# Patient Record
Sex: Male | Born: 1980 | Hispanic: Yes | Marital: Single | Smoking: Current every day smoker
Health system: Southern US, Community
[De-identification: ages and names within clinical notes are randomized; demographics above are authoritative.]

## PROBLEM LIST (undated history)

## (undated) DIAGNOSIS — H269 Unspecified cataract: Secondary | ICD-10-CM

## (undated) DIAGNOSIS — E119 Type 2 diabetes mellitus without complications: Secondary | ICD-10-CM

## (undated) HISTORY — DX: Type 2 diabetes mellitus without complications: E11.9

## (undated) HISTORY — DX: Unspecified cataract: H26.9

---

## 2014-05-05 ENCOUNTER — Ambulatory Visit (INDEPENDENT_AMBULATORY_CARE_PROVIDER_SITE_OTHER): Payer: BLUE CROSS/BLUE SHIELD | Admitting: Family Medicine

## 2014-05-05 ENCOUNTER — Ambulatory Visit (INDEPENDENT_AMBULATORY_CARE_PROVIDER_SITE_OTHER): Payer: BLUE CROSS/BLUE SHIELD

## 2014-05-05 VITALS — BP 112/72 | HR 78 | Temp 98.6°F | Resp 16 | Ht 66.5 in | Wt 181.4 lb

## 2014-05-05 DIAGNOSIS — E1165 Type 2 diabetes mellitus with hyperglycemia: Secondary | ICD-10-CM | POA: Diagnosis not present

## 2014-05-05 DIAGNOSIS — R1031 Right lower quadrant pain: Secondary | ICD-10-CM | POA: Diagnosis not present

## 2014-05-05 DIAGNOSIS — E119 Type 2 diabetes mellitus without complications: Secondary | ICD-10-CM

## 2014-05-05 DIAGNOSIS — Z113 Encounter for screening for infections with a predominantly sexual mode of transmission: Secondary | ICD-10-CM | POA: Diagnosis not present

## 2014-05-05 DIAGNOSIS — K5900 Constipation, unspecified: Secondary | ICD-10-CM

## 2014-05-05 LAB — POCT CBC
Granulocyte percent: 61.6 % (ref 37–80)
HCT, POC: 52.3 % (ref 43.5–53.7)
Hemoglobin: 17.5 g/dL (ref 14.1–18.1)
Lymph, poc: 2.2 (ref 0.6–3.4)
MCH, POC: 30.1 pg (ref 27–31.2)
MCHC: 33.4 g/dL (ref 31.8–35.4)
MCV: 90.1 fL (ref 80–97)
MID (cbc): 0.4 (ref 0–0.9)
MPV: 8.2 fL (ref 0–99.8)
POC Granulocyte: 4.3 (ref 2–6.9)
POC LYMPH PERCENT: 32.3 %L (ref 10–50)
POC MID %: 6.1 %M (ref 0–12)
Platelet Count, POC: 180 10*3/uL (ref 142–424)
RBC: 5.81 M/uL (ref 4.69–6.13)
RDW, POC: 13 %
WBC: 6.9 10*3/uL (ref 4.6–10.2)

## 2014-05-05 LAB — POCT URINALYSIS DIPSTICK
Bilirubin, UA: NEGATIVE
Blood, UA: NEGATIVE
Glucose, UA: 500
Ketones, UA: NEGATIVE
Leukocytes, UA: NEGATIVE
Nitrite, UA: NEGATIVE
Protein, UA: NEGATIVE
Spec Grav, UA: 1.01
Urobilinogen, UA: 0.2
pH, UA: 5

## 2014-05-05 LAB — POCT UA - MICROSCOPIC ONLY
Bacteria, U Microscopic: NEGATIVE
Casts, Ur, LPF, POC: NEGATIVE
Crystals, Ur, HPF, POC: NEGATIVE
Epithelial cells, urine per micros: NEGATIVE
Mucus, UA: NEGATIVE
WBC, Ur, HPF, POC: NEGATIVE
Yeast, UA: NEGATIVE

## 2014-05-05 LAB — GLUCOSE, POCT (MANUAL RESULT ENTRY): POC Glucose: 317 mg/dl — AB (ref 70–99)

## 2014-05-05 LAB — POCT GLYCOSYLATED HEMOGLOBIN (HGB A1C): Hemoglobin A1C: 12.2

## 2014-05-05 MED ORDER — POLYETHYLENE GLYCOL 3350 17 GM/SCOOP PO POWD: 17.0000 g | Freq: Two times a day (BID) | ORAL | Status: DC | PRN

## 2014-05-05 NOTE — Patient Instructions (Addendum)
Diabetes and Standards of Medical Care Diabetes is complicated. You may find that your diabetes team includes a dietitian, nurse, diabetes educator, eye doctor, and more. To help everyone know what is going on and to help you get the care you deserve, the following schedule of care was developed to help keep you on track. Below are the tests, exams, vaccines, medicines, education, and plans you will need. HbA1c test This test shows how well you have controlled your glucose over the past 2-3 months. It is used to see if your diabetes management plan needs to be adjusted.   It is performed at least 2 times a year if you are meeting treatment goals.  It is performed 4 times a year if therapy has changed or if you are not meeting treatment goals. Blood pressure test  This test is performed at every routine medical visit. The goal is less than 140/90 mm Hg for most people, but 130/80 mm Hg in some cases. Ask your health care provider about your goal. Dental exam  Follow up with the dentist regularly. Eye exam  If you are diagnosed with type 1 diabetes as a child, get an exam upon reaching the age of 37 years or older and have had diabetes for 3-5 years. Yearly eye exams are recommended after that initial eye exam.  If you are diagnosed with type 1 diabetes as an adult, get an exam within 5 years of diagnosis and then yearly.  If you are diagnosed with type 2 diabetes, get an exam as soon as possible after the diagnosis and then yearly. Foot care exam  Visual foot exams are performed at every routine medical visit. The exams check for cuts, injuries, or other problems with the feet.  A comprehensive foot exam should be done yearly. This includes visual inspection as well as assessing foot pulses and testing for loss of sensation.  Check your feet nightly for cuts, injuries, or other problems with your feet. Tell your health care provider if anything is not healing. Kidney function test (urine  microalbumin)  This test is performed once a year.  Type 1 diabetes: The first test is performed 5 years after diagnosis.  Type 2 diabetes: The first test is performed at the time of diagnosis.  A serum creatinine and estimated glomerular filtration rate (eGFR) test is done once a year to assess the level of chronic kidney disease (CKD), if present. Lipid profile (cholesterol, HDL, LDL, triglycerides)  Performed every 5 years for most people.  The goal for LDL is less than 100 mg/dL. If you are at high risk, the goal is less than 70 mg/dL.  The goal for HDL is 40 mg/dL-50 mg/dL for men and 50 mg/dL-60 mg/dL for women. An HDL cholesterol of 60 mg/dL or higher gives some protection against heart disease.  The goal for triglycerides is less than 150 mg/dL. Influenza vaccine, pneumococcal vaccine, and hepatitis B vaccine  The influenza vaccine is recommended yearly.  It is recommended that people with diabetes who are over 24 years old get the pneumonia vaccine. In some cases, two separate shots may be given. Ask your health care provider if your pneumonia vaccination is up to date.  The hepatitis B vaccine is also recommended for adults with diabetes. Diabetes self-management education  Education is recommended at diagnosis and ongoing as needed. Treatment plan  Your treatment plan is reviewed at every medical visit. Document Released: 12/19/2008 Document Revised: 07/08/2013 Document Reviewed: 07/24/2012 [facility] Patient Information 2015 [place],  LLC. This information is not intended to replace advice given to you by your health care provider. Make sure you discuss any questions you have with your health care provider. Diabetes y Kandace Blitz fsica (Diabetes and Exercise) Hacer actividad fsica con regularidad es muy importante. No se trata solo de [employer]. Tiene muchos otros beneficios, como por ejemplo:  Mejorar el estado fsico, la flexibilidad y la resistencia.  Aumenta  la densidad sea.  Ayuda a Technical sales engineer.  Disminuye la Air traffic controller.  Aumenta la fuerza muscular.  Reduce el estrs y las tensiones.  Mejora el estado de salud general. Las personas diabticas que realizan actividad fsica tienen beneficios adicionales debido al ejercicio:  Reduce el apetito.  El organismo mejora el uso del azcar (glucosa) de la [place].  Ayuda a disminuir o Product/process development scientist.  Disminuye la presin arterial.  Ayuda a disminuir los lpidos en la sangre (colesterol y triglicridos).  El organismo mejora el uso de la insulina porque:  Aumenta la sensibilidad del organismo a la insulina.  Reduce las necesidades de insulina del organismo.  Disminuye el riesgo de enfermedad cardaca por la actividad fsica ya que  disminuye el colesterol y [employer] triglicridos.  Aumenta los niveles de colesterol bueno (como las lipoprotenas de alta densidad [HDL]) en el organismo.  Disminuye los niveles de glucosa en la [place]. SU PLAN DE ACTIVIDAD  Elija una actividad que disfrute y establezca objetivos realistas. Su mdico o educador en diabetes podrn ayudarlo a encontrar una actividad que lo beneficie. Haga ejercicio regularmente como se lo haya indicado el mdico. Esto incluye:  Hacer entrenamiento de [employer] a la semana, como flexiones, sentadillas, levantar peso o usar bandas de resistencia.  Practicar 156minutos de ejercicios cardiovasculares cada semana, como caminar, correr o hacer algn deporte.  Mantenerse activo y no permanecer inactivo durante ms de 73minutos seguidos. Los perodos cortos de [place] tambin son beneficiosos. Tres sesiones de 75minutos a lo largo del da son tan beneficiosas como una sola sesin de 54minutos. Estas son algunas ideas para los ejercicios:  Lleve a Probation officer.  Utilice las Clinical cytogeneticist del ascensor.  Baile su cancin favorita.  Haga los ejercicios de un video de  ejercicios.  Haga sus ejercicios favoritos con [place]. RECOMENDACIONES PARA REALIZAR EJERCICIOS CUANDO SE TIENE DIABETES TIPO 1 O TIPO 2   Controle la glucosa en la sangre antes de comenzar. Si el nivel de glucosa en la sangre es de ms de 240 mg/dl, controle las cetonas en la [place]. No haga actividad fsica si hay cetonas.  Evite inyectarse insulina en las zonas del cuerpo que ejercitar. Por ejemplo, evite inyectarse insulina en:  Los brazos, si juega al tenis.  Las piernas, si corre.  Lleve un registro de:  Los alimentos que consume antes y despus de TEFL teacher.  Los momentos esperables de picos de accin de la insulina.  Los niveles de glucosa en la sangre antes y despus de hacer ejercicios.  El tipo y cantidad de [place] fsica que Musician.  Revise los registros con su mdico. El mdico lo ayudar a Actor pautas para ajustar la cantidad de alimento y las cantidades de insulina antes y despus de Field seismologist ejercicios.  Si toma insulina o agentes hipoglucemiantes por va oral, observe si hay signos y sntomas de hipoglucemia. Entre los que se incluyen:  Mareos.  Temblores.  Sudoracin.  Escalofros.  Confusin.  Beba gran cantidad de agua mientras hace ejercicios para  evitar la deshidratacin o los Economist. Durante la actividad fsica se pierde agua corporal que se debe reponer.  Comente con su mdico antes de comenzar un programa de actividad fsica para verificar que sea seguro para usted. Recuerde, cualquier actividad es mejor que ninguna. Document Released: 03/13/2007 Document Revised: 07/08/2013 [facility] Patient Information 2015 [place], [place]. This information is not intended to replace advice given to you by your health care provider. Make sure you discuss any questions you have with your health care provider.   Increase Levemir to 20 untis nightly, if your fasting blood sugar in the morning is still above 150 then you can increase  Levemir to 23 units after 4 days

## 2014-05-05 NOTE — Progress Notes (Signed)
Chief Complaint:  Chief Complaint  Patient presents with  . Abdominal Pain    x 4 day     HPI: Mark Tyler is a 34 y.o. male who is here for  4 day history of right upper qiadrant abd pain, constant, denies fevers, chill , fevers, no prob;ems with eating.  He has not done anythign since  4 days ago when this happened. Ha snot had this problem before. Minimal gas, normal BMs , last one was thismorning. Smallpellets but he deneies constipation  No blood in urine , no problems with uriantion.  RLQ abd pain , Sharp pain and radiates down to umbilicus.  No prior renal stones, no urianry sxs.   He has diabetes,  PCP is [facility] He is taking his metformin and Amaryl Denies diabetic gastroparesis sxs   Past Medical History  Diagnosis Date  . Cataract   . Diabetes mellitus without complication    No past surgical history on file. History   Social History  . Marital Status: Single    Spouse Name: N/A  . Number of Children: N/A  . Years of Education: N/A   Social History Main Topics  . Smoking status: Current Every Day Smoker -- 0.50 packs/day for 12 years    Types: Cigarettes  . Smokeless tobacco: Not on file  . Alcohol Use: No  . Drug Use: No  . Sexual Activity: Not on file   Other Topics Concern  . None   Social History Narrative  . None   Family History  Problem Relation Age of Onset  . Diabetes Mother   . Diabetes Brother    No Known Allergies Prior to Admission medications   Medication Sig Start Date End Date Taking? Authorizing Provider  glimepiride (AMARYL) 4 MG tablet Take 4 mg by mouth daily with breakfast.   Yes Historical Provider, MD  metFORMIN (GLUCOPHAGE) 850 MG tablet Take 850 mg by mouth daily.   Yes Historical Provider, MD     ROS: The patient denies fevers, chills, night sweats, unintentional weight loss, chest pain, palpitations, wheezing, dyspnea on exertion, nausea, vomiting,  dysuria, hematuria, melena, numbness, weakness,  or tingling.   All other systems have been reviewed and were otherwise negative with the exception of those mentioned in the HPI and as above.    PHYSICAL EXAM: Filed Vitals:   05/05/14 1312  BP: 112/72  Pulse: 78  Temp: 98.6 F (37 C)  Resp: 16   Filed Vitals:   05/05/14 1312  Height: 5' 6.5" (1.689 m)  Weight: 181 lb 6.4 oz (82.283 kg)   Body mass index is 28.84 kg/(m^2).  General: Alert, no acute distress HEENT:  Normocephalic, atraumatic, oropharynx patent. EOMI, PERRLA Cardiovascular:  Regular rate and rhythm, no rubs murmurs or gallops.  No Carotid bruits, radial pulse intact. No pedal edema.  Respiratory: Clear to auscultation bilaterally.  No wheezes, rales, or rhonchi.  No cyanosis, no use of accessory musculature GI: No organomegaly, abdomen is soft and non-tender, positive bowel sounds.  No masses. Skin: No rashes. Neurologic: Facial musculature symmetric. Psychiatric: Patient is appropriate throughout our interaction. Lymphatic: No cervical lymphadenopathy Musculoskeletal: Gait intact.   LABS: Results for orders placed or performed in visit on 05/05/14  POCT CBC  Result Value Ref Range   WBC 6.9 4.6 - 10.2 K/uL   Lymph, poc 2.2 0.6 - 3.4   POC LYMPH PERCENT 32.3 10 - 50 %L   MID (cbc) 0.4 0 -  0.9   POC MID % 6.1 0 - 12 %M   POC Granulocyte 4.3 2 - 6.9   Granulocyte percent 61.6 37 - 80 %G   RBC 5.81 4.69 - 6.13 M/uL   Hemoglobin 17.5 14.1 - 18.1 g/dL   HCT, POC 82.952.3 56.243.5 - 53.7 %   MCV 90.1 80 - 97 fL   MCH, POC 30.1 27 - 31.2 pg   MCHC 33.4 31.8 - 35.4 g/dL   RDW, POC 13.013.0 %   Platelet Count, POC 180 142 - 424 K/uL   MPV 8.2 0 - 99.8 fL  POCT UA - Microscopic Only  Result Value Ref Range   WBC, Ur, HPF, POC neg    RBC, urine, microscopic 0-2    Bacteria, U Microscopic neg    Mucus, UA neg    Epithelial cells, urine per micros neg    Crystals, Ur, HPF, POC neg    Casts, Ur, LPF, POC neg    Yeast, UA neg   POCT urinalysis dipstick  Result  Value Ref Range   Color, UA yellow    Clarity, UA clear    Glucose, UA 500    Bilirubin, UA neg    Ketones, UA neg    Spec Grav, UA 1.010    Blood, UA neg    pH, UA 5.0    Protein, UA neg    Urobilinogen, UA 0.2    Nitrite, UA neg    Leukocytes, UA Negative   POCT glucose (manual entry)  Result Value Ref Range   POC Glucose 317 (A) 70 - 99 mg/dl  POCT glycosylated hemoglobin (Hb A1C)  Result Value Ref Range   Hemoglobin A1C 12.2      EKG/XRAY:   Primary [facility]. + stool, no acute abd process   ASSESSMENT/PLAN: Encounter Diagnoses  Name Primary?  . RLQ abdominal pain Yes  . Type 2 diabetes mellitus without complication   . Screening for STD (sexually transmitted disease)   . Constipation, unspecified constipation type    Poorly controlled DM-INcrease LEvemir to 20 units x 3 days, FBG goal in Am is < 150, if > 150 then will increase be another 3 untis to 123 If blood glucose still high then will need f.u  Hypoglycemia precautions given  He is also on metformin and glipiride but works in [employer] representative[employer] so sorrisome for hypoglycemia Rx miralax STD labs pending  Recommend : ADA diet, BP goal <140/90, daily foot exams, tobacco cessation if smoking, annual eye exam, annual flu vaccine, PNA vaccine if age and time appropriate.  Gross sideeffects, risk and benefits, and alternatives of medications d/w patient. Patient is aware that all medications have potential sideeffects and we are unable to predict every sideeffect or drug-drug interaction that may occur.  Hamilton CapriLE, THAO PHUONG, DO 05/05/2014 2:55 PM

## 2014-05-06 LAB — COMPLETE METABOLIC PANEL WITHOUT GFR
Albumin: 4.9 g/dL (ref 3.5–5.2)
CO2: 30 meq/L (ref 19–32)
Chloride: 97 meq/L (ref 96–112)
GFR, Est African American: >89 mL/min
Potassium: 4.3 meq/L (ref 3.5–5.3)

## 2014-05-06 LAB — COMPLETE METABOLIC PANEL WITH GFR
ALT: 34 U/L (ref 0–53)
AST: 19 U/L (ref 0–37)
Alkaline Phosphatase: 137 U/L — ABNORMAL HIGH (ref 39–117)
BUN: 14 mg/dL (ref 6–23)
Calcium: 10.2 mg/dL (ref 8.4–10.5)
Creat: 0.86 mg/dL (ref 0.50–1.35)
GFR, Est Non African American: >89 mL/min
Glucose, Bld: 318 mg/dL — ABNORMAL HIGH (ref 70–99)
Sodium: 136 mEq/L (ref 135–145)
Total Bilirubin: 0.6 mg/dL (ref 0.2–1.2)
Total Protein: 7.5 g/dL (ref 6.0–8.3)

## 2014-05-06 LAB — GC/CHLAMYDIA PROBE AMP
CT Probe RNA: NEGATIVE
GC Probe RNA: NEGATIVE

## 2014-05-14 ENCOUNTER — Telehealth: Payer: Self-pay | Admitting: Family Medicine

## 2014-05-14 NOTE — Telephone Encounter (Signed)
Spoke to patient about labs. He is taking his metformin BID, Amaryl daily, levemir is 20 units, still has high FBG 200s. He is takingmiralax, still has some abd pain. Will increase his Levemir to 25 units. Advise to take miralax BID prn and see how he does. He will follow-up next week with labs and also all his meds.

## 2014-06-11 ENCOUNTER — Ambulatory Visit (INDEPENDENT_AMBULATORY_CARE_PROVIDER_SITE_OTHER): Payer: BLUE CROSS/BLUE SHIELD

## 2014-06-11 ENCOUNTER — Ambulatory Visit (INDEPENDENT_AMBULATORY_CARE_PROVIDER_SITE_OTHER): Payer: BLUE CROSS/BLUE SHIELD | Admitting: Family Medicine

## 2014-06-11 VITALS — BP 120/82 | HR 79 | Temp 98.1°F | Resp 16 | Ht 66.0 in | Wt 184.0 lb

## 2014-06-11 DIAGNOSIS — R1031 Right lower quadrant pain: Secondary | ICD-10-CM | POA: Diagnosis not present

## 2014-06-11 DIAGNOSIS — K5900 Constipation, unspecified: Secondary | ICD-10-CM

## 2014-06-11 DIAGNOSIS — M545 Low back pain, unspecified: Secondary | ICD-10-CM

## 2014-06-11 LAB — POCT URINALYSIS DIPSTICK
Bilirubin, UA: NEGATIVE
Blood, UA: NEGATIVE
Glucose, UA: 500
Ketones, UA: NEGATIVE
LEUKOCYTES UA: NEGATIVE
NITRITE UA: NEGATIVE
PH UA: 5
PROTEIN UA: NEGATIVE
Spec Grav, UA: <=1.005
UROBILINOGEN UA: 0.2

## 2014-06-11 LAB — POCT CBC
Granulocyte percent: 60.8 %G (ref 37–80)
HEMATOCRIT: 50.5 % (ref 43.5–53.7)
HEMOGLOBIN: 16.9 g/dL (ref 14.1–18.1)
LYMPH, POC: 2.4 (ref 0.6–3.4)
MCH: 29.3 pg (ref 27–31.2)
MCHC: 33.4 g/dL (ref 31.8–35.4)
MCV: 87.7 fL (ref 80–97)
MID (cbc): 0.4 (ref 0–0.9)
MPV: 8.5 fL (ref 0–99.8)
PLATELET COUNT, POC: 179 10*3/uL (ref 142–424)
POC Granulocyte: 4.4 (ref 2–6.9)
POC LYMPH PERCENT: 33.1 %L (ref 10–50)
POC MID %: 6.1 %M (ref 0–12)
RBC: 5.76 M/uL (ref 4.69–6.13)
RDW, POC: 13 %
WBC: 7.3 10*3/uL (ref 4.6–10.2)

## 2014-06-11 LAB — POCT UA - MICROSCOPIC ONLY
Bacteria, U Microscopic: NEGATIVE
Casts, Ur, LPF, POC: NEGATIVE
Crystals, Ur, HPF, POC: NEGATIVE
Epithelial cells, urine per micros: NEGATIVE
Mucus, UA: NEGATIVE
RBC, URINE, MICROSCOPIC: NEGATIVE
WBC, UR, HPF, POC: NEGATIVE
Yeast, UA: NEGATIVE

## 2014-06-11 NOTE — Patient Instructions (Signed)
Take MiraLAX daily until stools are too loose, then decrease to every 2-3 days to keep you moving  Get a bottle of magnesium citrate laxative to drink.  May repeat tomorrow if needed.  If MiraLAX and magnesium citrate are not doing the job, you can also try using Dulcolax tablets per instructions on the box.  If you develop more acute severe abdominal pain return or go to the emergency room  For long-term treatment of constipation increase your fluid intake and fruits and vegetables. Avoid excessive cheese and bananas.  Estreimiento (Constipation) Estreimiento significa que una persona tiene menos de tres evacuaciones en una semana, dificultad para defecar, o que las heces son secas, duras, o ms grandes que lo normal. A medida que envejecemos el estreimiento es ms comn. Si intenta curar el estreimiento con medicamentos que producen la evacuacin de las heces (laxantes), el problema puede empeorar. El uso prolongado de laxantes puede hacer que los msculos del colon se debiliten. Una dieta baja en fibra, no tomar suficientes lquidos y el uso de ciertos medicamentos pueden Scientist, research (life sciences)empeorar el estreimiento.  CAUSAS   Ciertos medicamentos, como los antidepresivos, analgsicos, suplementos de hierro, anticidos y diurticos.  Algunas enfermedades, como la diabetes, el sndrome del colon irritable, enfermedad de la tiroides, o depresin.  No beber suficiente agua.  No consumir suficientes alimentos ricos en fibra.  Situaciones de estrs o viajes.  Falta de actividad fsica o de ejercicio.  Ignorar la necesidad sbita de Advertising copywriterdefecar.  Uso en exceso de laxantes. SIGNOS Y SNTOMAS   Defecar menos de tres veces por semana.  Dificultad para defecar.  Tener las heces secas y duras, o ms grandes que las normales.  Sensacin de estar lleno o hinchado.  Dolor en la parte baja del abdomen.  No sentir alivio despus de defecar. DIAGNSTICO  El mdico le har una historia clnica y un examen  fsico. Pueden hacerle exmenes adicionales para el estreimiento grave. Estos estudios pueden ser:  Un radiografa con enema de bario para examinar el recto, el colon y, en algunos casos, el intestino delgado.  Una sigmoidoscopia para examinar el colon inferior.  Una colonoscopia para examinar todo el colon. TRATAMIENTO  El tratamiento depender de la gravedad del estreimiento y de la causa. Algunos tratamientos nutricionales son beber ms lquidos y comer ms alimentos ricos en fibra. El cambio en el estilo de vida incluye hacer ejercicios de [place] regular. Si estas recomendaciones para Public relations account executiverealizar cambios en la dieta y en el estilo de vida no ayudan, el mdico le puede indicar el uso de laxantes de venta libre para ayudarlo a Advertising copywriterdefecar. Los medicamentos recetados se pueden prescribir si los medicamentos de venta libre no lo [place].  INSTRUCCIONES PARA EL CUIDADO EN EL HOGAR   Consuma alimentos con alto contenido de [place]ra, como frutas, vegetales, cereales integrales y porotos.  Limite los alimentos procesados ricos en grasas y azcar, como las papas fritas, hamburguesas, galletas, dulces y refrescos.  Puede agregar un suplemento de fibra a su dieta si no obtiene lo suficiente de los alimentos.  Beba suficiente lquido para Photographermantener la orina clara o de color amarillo plido.  Haga ejercicio regularmente o segn las indicaciones del mdico.  Vaya al bao cuando sienta la necesidad de ir. No se aguante las ganas.  Tome solo medicamentos de venta libre o recetados, segn las indicaciones del mdico. No tome otros medicamentos para el estreimiento sin consultarlo antes con su mdico. SOLICITE ATENCIN MDICA DE INMEDIATO SI:   Observa sangre brillante en las  heces.  El estreimiento dura ms de 4 das o [place].  Siente dolor abdominal o rectal.  Las heces son delgadas como un lpiz.  Pierde peso de [place] inexplicable. ASEGRESE DE QUE:   Comprende estas instrucciones.  Controlar  su afeccin.  Recibir ayuda de inmediato si no mejora o si empeora. Document Released: 03/13/2007 Document Revised: 02/26/2013 [facility] Patient Information 2015 [place], [place]. This information is not intended to replace advice given to you by your health care provider. Make sure you discuss any questions you have with your health care provider.

## 2014-06-11 NOTE — Progress Notes (Signed)
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 He has been hurting in the right lower quadrant of the abdomen since he was here a couple of months ago. He hurts almost every day. He vomited once Sunday. His bowels usually active, not necessarily daily. He denies constipation or diarrhea. He has not had any dysuria. No sexual problems. He hurts in his spine of his lower back. Also hurts around to the right flank and in the right lower quadrant. He knows of no specific injury. He is diabetic, but that is not given any acute complaints.  Objective: He is tender in the upper lumbar or lower thoracic portion of his back. No CVA tenderness on the left but is mildly tender on the right. He is more tender below the CVA area in the right low back. The pain is also present in the McBurney's area of the right lower quadrant, though not exquisitely so. No rebound. Normal bowel sounds.  Assessment: Low back and left lower quadrant abdominal pain  Plan: Repeat CBC, urinalysis, and a abdominal x-ray  Results for orders placed or performed in visit on 06/11/14  POCT urinalysis dipstick  Result Value Ref Range   Color, UA yellow    Clarity, UA clear    Glucose, UA 500    Bilirubin, UA neg    Ketones, UA neg    Spec Grav, UA <=1.005    Blood, UA neg    pH, UA 5.0    Protein, UA neg    Urobilinogen, UA 0.2    Nitrite, UA neg    Leukocytes, UA Negative   POCT UA - Microscopic Only  Result Value Ref Range   WBC, Ur, HPF, POC neg    RBC, urine, microscopic neg    Bacteria, U Microscopic neg    Mucus, UA neg    Epithelial cells, urine per micros neg    Crystals, Ur, HPF, POC neg    Casts, Ur, LPF, POC neg    Yeast, UA neg   POCT CBC  Result Value Ref Range   WBC 7.3 4.6 - 10.2 K/uL   Lymph, poc 2.4 0.6 - 3.4   POC LYMPH PERCENT 33.1 10 - 50 %L   MID (cbc) 0.4 0 - 0.9   POC MID % 6.1 0 - 12 %M   POC Granulocyte 4.4 2 - 6.9   Granulocyte percent 60.8 37 - 80 %G   RBC 5.76 4.69 -  6.13 M/uL   Hemoglobin 16.9 14.1 - 18.1 g/dL   HCT, POC 40.150.5 02.743.5 - 53.7 %   MCV 87.7 80 - 97 fL   MCH, POC 29.3 27 - 31.2 pg   MCHC 33.4 31.8 - 35.4 g/dL   RDW, POC 25.313.0 %   Platelet Count, POC 179 142 - 424 K/uL   MPV 8.5 0 - 99.8 fL   [facility] reading (PRIMARY) by  Dr. Alwyn RenHopper Excessive stool consistent with constipation. No stones or other problems noted.  Plan: Laxatives.

## 2014-06-16 ENCOUNTER — Emergency Department (HOSPITAL_COMMUNITY): Payer: BLUE CROSS/BLUE SHIELD

## 2014-06-16 ENCOUNTER — Emergency Department (HOSPITAL_COMMUNITY)
Admission: EM | Admit: 2014-06-16 | Discharge: 2014-06-16 | Disposition: A | Discharge status: Home / Self Care | Payer: BLUE CROSS/BLUE SHIELD | Attending: Emergency Medicine | Admitting: Emergency Medicine

## 2014-06-16 ENCOUNTER — Encounter (HOSPITAL_COMMUNITY): Payer: Self-pay

## 2014-06-16 DIAGNOSIS — K5900 Constipation, unspecified: Secondary | ICD-10-CM | POA: Diagnosis present

## 2014-06-16 DIAGNOSIS — Z794 Long term (current) use of insulin: Secondary | ICD-10-CM | POA: Insufficient documentation

## 2014-06-16 DIAGNOSIS — Z79899 Other long term (current) drug therapy: Secondary | ICD-10-CM | POA: Insufficient documentation

## 2014-06-16 DIAGNOSIS — R739 Hyperglycemia, unspecified: Secondary | ICD-10-CM

## 2014-06-16 DIAGNOSIS — Z720 Tobacco use: Secondary | ICD-10-CM | POA: Insufficient documentation

## 2014-06-16 DIAGNOSIS — Z8669 Personal history of other diseases of the nervous system and sense organs: Secondary | ICD-10-CM | POA: Insufficient documentation

## 2014-06-16 DIAGNOSIS — E1165 Type 2 diabetes mellitus with hyperglycemia: Secondary | ICD-10-CM | POA: Insufficient documentation

## 2014-06-16 DIAGNOSIS — M545 Low back pain: Secondary | ICD-10-CM | POA: Insufficient documentation

## 2014-06-16 LAB — COMPREHENSIVE METABOLIC PANEL
ALK PHOS: 106 U/L (ref 39–117)
ALT: 25 U/L (ref 0–53)
ANION GAP: 8 (ref 5–15)
AST: 17 U/L (ref 0–37)
Albumin: 4.3 g/dL (ref 3.5–5.2)
BUN: 14 mg/dL (ref 6–23)
CO2: 27 mmol/L (ref 19–32)
Calcium: 9.4 mg/dL (ref 8.4–10.5)
Chloride: 98 mmol/L (ref 96–112)
Creatinine, Ser: 0.9 mg/dL (ref 0.50–1.35)
GLUCOSE: 357 mg/dL — AB (ref 70–99)
POTASSIUM: 5.1 mmol/L (ref 3.5–5.1)
SODIUM: 133 mmol/L — AB (ref 135–145)
Total Bilirubin: 0.7 mg/dL (ref 0.3–1.2)
Total Protein: 6.7 g/dL (ref 6.0–8.3)

## 2014-06-16 LAB — URINE MICROSCOPIC-ADD ON

## 2014-06-16 LAB — URINALYSIS, ROUTINE W REFLEX MICROSCOPIC
BILIRUBIN URINE: NEGATIVE
HGB URINE DIPSTICK: NEGATIVE
Ketones, ur: NEGATIVE mg/dL
Leukocytes, UA: NEGATIVE
NITRITE: NEGATIVE
PH: 5 (ref 5.0–8.0)
Protein, ur: NEGATIVE mg/dL
SPECIFIC GRAVITY, URINE: 1.031 — AB (ref 1.005–1.030)
Urobilinogen, UA: 0.2 mg/dL (ref 0.0–1.0)

## 2014-06-16 LAB — CBC WITH DIFFERENTIAL/PLATELET
BASOS ABS: 0 10*3/uL (ref 0.0–0.1)
Basophils Relative: 0 % (ref 0–1)
Eosinophils Absolute: 0.1 10*3/uL (ref 0.0–0.7)
Eosinophils Relative: 1 % (ref 0–5)
HCT: 47.2 % (ref 39.0–52.0)
Hemoglobin: 17 g/dL (ref 13.0–17.0)
LYMPHS ABS: 2 10*3/uL (ref 0.7–4.0)
Lymphocytes Relative: 33 % (ref 12–46)
MCH: 31 pg (ref 26.0–34.0)
MCHC: 36 g/dL (ref 30.0–36.0)
MCV: 86.1 fL (ref 78.0–100.0)
MONO ABS: 0.4 10*3/uL (ref 0.1–1.0)
MONOS PCT: 6 % (ref 3–12)
NEUTROS ABS: 3.7 10*3/uL (ref 1.7–7.7)
Neutrophils Relative %: 60 % (ref 43–77)
Platelets: 155 10*3/uL (ref 150–400)
RBC: 5.48 MIL/uL (ref 4.22–5.81)
RDW: 12.5 % (ref 11.5–15.5)
WBC: 6.2 10*3/uL (ref 4.0–10.5)

## 2014-06-16 LAB — LIPASE, BLOOD: LIPASE: 55 U/L (ref 11–59)

## 2014-06-16 MED ORDER — POLYETHYLENE GLYCOL 3350 17 GM/SCOOP PO POWD: 17.0000 g | Freq: Two times a day (BID) | ORAL | Status: AC

## 2014-06-16 NOTE — ED Notes (Signed)
Pt. Reports constipation x1 month. Went to the clinic x1 month ago and given miralax and went again last week and given mag citrate. States that these medicines did not help. LBM on Saturday - watery. Reports lower abdominal pain and vomiting x1.

## 2014-06-16 NOTE — ED Notes (Signed)
Pt undressed, in gown, on continuous pulse oximetry and blood pressure cuff 

## 2014-06-16 NOTE — ED Notes (Signed)
MD at bedside. 

## 2014-06-16 NOTE — Discharge Instructions (Signed)
Estreimiento (Constipation) Estreimiento significa que una persona tiene menos de tres evacuaciones en una semana, dificultad para defecar, o que las heces son secas, duras, o ms grandes que lo normal. A medida que envejecemos el estreimiento es ms comn. Si intenta curar el estreimiento con medicamentos que producen la evacuacin de las heces (laxantes), el problema puede empeorar. El uso prolongado de laxantes puede hacer que los msculos del colon se debiliten. Una dieta baja en fibra, no tomar suficientes lquidos y el uso de ciertos medicamentos pueden empeorar el estreimiento.  CAUSAS   Ciertos medicamentos, como los antidepresivos, analgsicos, suplementos de hierro, anticidos y diurticos.  Algunas enfermedades, como la diabetes, el sndrome del colon irritable, enfermedad de la tiroides, o depresin.  No beber suficiente agua.  No consumir suficientes alimentos ricos en fibra.  Situaciones de estrs o viajes.  Falta de actividad fsica o de ejercicio.  Ignorar la necesidad sbita de defecar.  Uso en exceso de laxantes. SIGNOS Y SNTOMAS   Defecar menos de tres veces por semana.  Dificultad para defecar.  Tener las heces secas y duras, o ms grandes que las normales.  Sensacin de estar lleno o hinchado.  Dolor en la parte baja del abdomen.  No sentir alivio despus de defecar. DIAGNSTICO  El mdico le har una historia clnica y un examen fsico. Pueden hacerle exmenes adicionales para el estreimiento grave. Estos estudios pueden ser:  Un radiografa con enema de bario para examinar el recto, el colon y, en algunos casos, el intestino delgado.  Una sigmoidoscopia para examinar el colon inferior.  Una colonoscopia para examinar todo el colon. TRATAMIENTO  El tratamiento depender de la gravedad del estreimiento y de la causa. Algunos tratamientos nutricionales son beber ms lquidos y comer ms alimentos ricos en fibra. El cambio en el estilo de vida  incluye hacer ejercicios de manera regular. Si estas recomendaciones para realizar cambios en la dieta y en el estilo de vida no ayudan, el mdico le puede indicar el uso de laxantes de venta libre para ayudarlo a defecar. Los medicamentos recetados se pueden prescribir si los medicamentos de venta libre no lo ayudan.  INSTRUCCIONES PARA EL CUIDADO EN EL HOGAR   Consuma alimentos con alto contenido de fibra, como frutas, vegetales, cereales integrales y porotos.  Limite los alimentos procesados ricos en grasas y azcar, como las papas fritas, hamburguesas, galletas, dulces y refrescos.  Puede agregar un suplemento de fibra a su dieta si no obtiene lo suficiente de los alimentos.  Beba suficiente lquido para mantener la orina clara o de color amarillo plido.  Haga ejercicio regularmente o segn las indicaciones del mdico.  Vaya al bao cuando sienta la necesidad de ir. No se aguante las ganas.  Tome solo medicamentos de venta libre o recetados, segn las indicaciones del mdico. No tome otros medicamentos para el estreimiento sin consultarlo antes con su mdico. SOLICITE ATENCIN MDICA DE INMEDIATO SI:   Observa sangre brillante en las heces.  El estreimiento dura ms de 4 das o empeora.  Siente dolor abdominal o rectal.  Las heces son delgadas como un lpiz.  Pierde peso de manera inexplicable. ASEGRESE DE QUE:   Comprende estas instrucciones.  Controlar su afeccin.  Recibir ayuda de inmediato si no mejora o si empeora. Document Released: 03/13/2007 Document Revised: 02/26/2013 [employer] Patient Information 2015 [employer]. This information is not intended to replace advice given to you by your health care provider. Make sure you discuss any questions you have with your health   care provider.  

## 2014-06-16 NOTE — ED Provider Notes (Signed)
CSN: [id number]     Arrival date & time 06/16/14  1250 History   First MD Initiated Contact with Patient 06/16/14 1903     Chief Complaint  Patient presents with  . Constipation     (Consider location/radiation/quality/duration/timing/severity/associated sxs/prior Treatment) Patient is a 34 y.o. male presenting with constipation.  Constipation Severity:  Severe Time since last bowel movement:  4 weeks Timing:  Constant Progression:  Worsening Chronicity:  New Context comment:  Seen PCP twice Relieved by:  Nothing Ineffective treatments: miralax, mag citrate. Associated symptoms: abdominal pain (lower, crampy) and back pain (right low back)   Associated symptoms: no fever, no nausea and no vomiting     Past Medical History  Diagnosis Date  . Cataract   . Diabetes mellitus without complication    History reviewed. No pertinent past surgical history. Family History  Problem Relation Age of Onset  . Diabetes Mother   . Diabetes Brother    History  Substance Use Topics  . Smoking status: Current Every Day Smoker -- 0.50 packs/day for 12 years    Types: Cigarettes  . Smokeless tobacco: Not on file  . Alcohol Use: No    Review of Systems  Constitutional: Negative for fever.  Gastrointestinal: Positive for abdominal pain (lower, crampy) and constipation. Negative for nausea and vomiting.  Musculoskeletal: Positive for back pain (right low back).  All other systems reviewed and are negative.     Allergies  Review of patient's allergies indicates no known allergies.  Home Medications   Prior to Admission medications   Medication Sig Start Date End Date Taking? Authorizing Provider  glimepiride (AMARYL) 4 MG tablet Take 4 mg by mouth daily with breakfast.   Yes Historical Provider, MD  insulin detemir (LEVEMIR) 100 UNIT/ML injection Inject 17 Units into the skin at bedtime.   Yes Historical Provider, MD  magnesium citrate SOLN Take 1 Bottle by mouth once.   Yes  Historical Provider, MD  metFORMIN (GLUCOPHAGE) 850 MG tablet Take 850 mg by mouth daily.   Yes Historical Provider, MD  polyethylene glycol powder (GLYCOLAX/MIRALAX) powder Take 17 g by mouth 2 (two) times daily as needed. 05/05/14   Thao P Le, DO   BP 126/84 mmHg  Pulse 91  Temp(Src) 98 F (36.7 C) (Oral)  Resp 16  SpO2 99% Physical Exam  Constitutional: He is oriented to person, place, and time. He appears well-developed and well-nourished. No distress.  HENT:  Head: Normocephalic and atraumatic.  Eyes: Conjunctivae are normal. No scleral icterus.  Neck: Neck supple.  Cardiovascular: Normal rate and intact distal pulses.   Pulmonary/Chest: Effort normal. No stridor. No respiratory distress.  Abdominal: Normal appearance. He exhibits no distension. There is tenderness in the right lower quadrant, suprapubic area and left lower quadrant. There is no CVA tenderness.  Musculoskeletal:       Back:  Neurological: He is alert and oriented to person, place, and time.  Skin: Skin is warm and dry. No rash noted.  Psychiatric: He has a normal mood and affect. His behavior is normal.  Nursing note and vitals reviewed.   ED Course  Procedures (including critical care time) Labs Review Labs Reviewed  COMPREHENSIVE METABOLIC PANEL - Abnormal; Notable for the following:    Sodium 133 (*)    Glucose, Bld 357 (*)    All other components within normal limits  URINALYSIS, ROUTINE W REFLEX MICROSCOPIC - Abnormal; Notable for the following:    APPearance CLOUDY (*)    Specific Gravity,  Urine 1.031 (*)    Glucose, UA >1000 (*)    All other components within normal limits  CBC WITH DIFFERENTIAL/PLATELET  LIPASE, BLOOD  URINE MICROSCOPIC-ADD ON    Imaging Review Dg Abd Acute W/chest  06/16/2014   CLINICAL DATA:  Constipation.  Lower abdominal pain for 1 week.  EXAM: DG ABDOMEN ACUTE W/ 1V CHEST  COMPARISON:  None.  FINDINGS: The lungs appear clear.  Cardiac and mediastinal contours normal.   No pleural effusion identified.  No free intraperitoneal gas beneath the hemidiaphragms.  Prominent stool throughout the colon favors constipation. No dilated small bowel loops or abnormal air-fluid levels.  IMPRESSION: 1.  Prominent stool throughout the colon favors constipation.   Electronically Signed   By: Gaylyn RongWalter  Liebkemann M.D.   On: 06/16/2014 21:16     EKG Interpretation None      MDM   Final diagnoses:  Constipation, unspecified constipation type  Hyperglycemia    34 yo male with 1 month of constipation, low abdominal pain, and right low back pain.  Has seen urgent care twice for same symptoms.  Has tried Mag Citrate and Miralax without help.  He is well appearing with a soft abdomen, but does have some lower abdominal tenderness.  Plan AAS, urinalysis.    AAS consistent with increased stool burden.  Labs show hyperglycemia, which is consistent with his last clinic value.  He endorses not taking his Levemir.  Advised to take his meds as prescribed.  He has also been noncompliant with miralax (last dose last week).  Advised he take the miralax and follow up with PCP.    Blake DivineJohn Jaymien Landin, MD 06/16/14 2325

## 2016-09-16 IMAGING — CR DG ABDOMEN ACUTE W/ 1V CHEST
3 series · 3 of 3 positions shown · non-contrast
Comparison: None.

CLINICAL DATA: Constipation.  Lower abdominal pain for 1 week.

EXAM:
DG ABDOMEN ACUTE W/ 1V CHEST

[w chest pa]
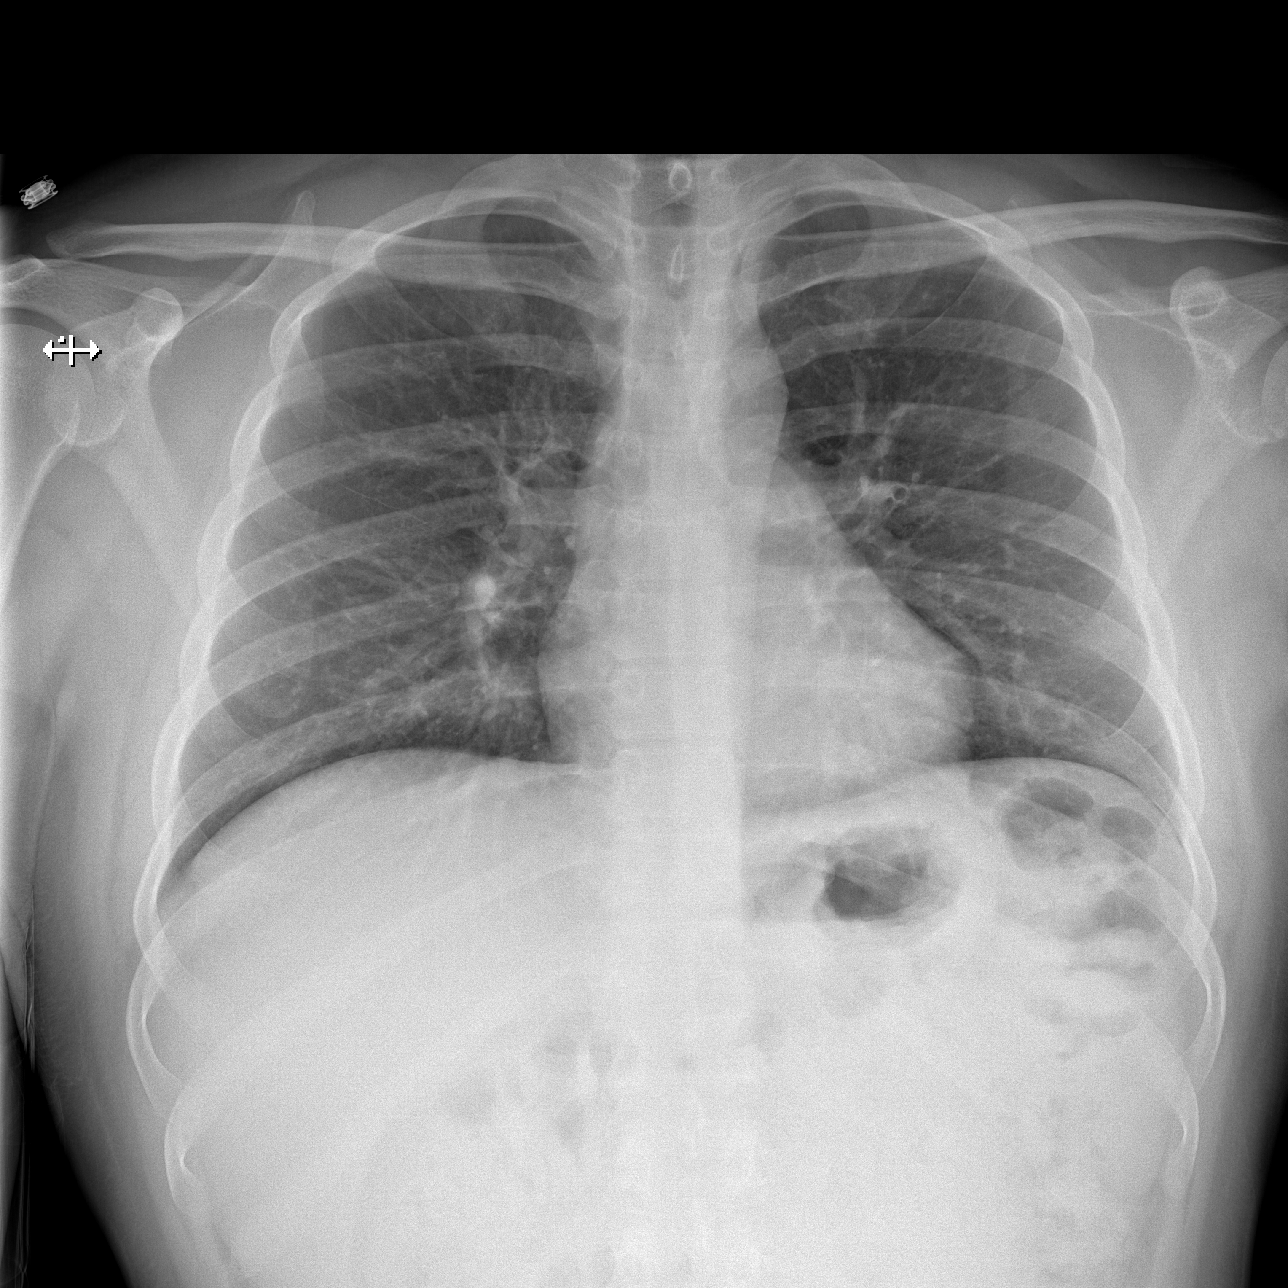

[w abdomen upright]
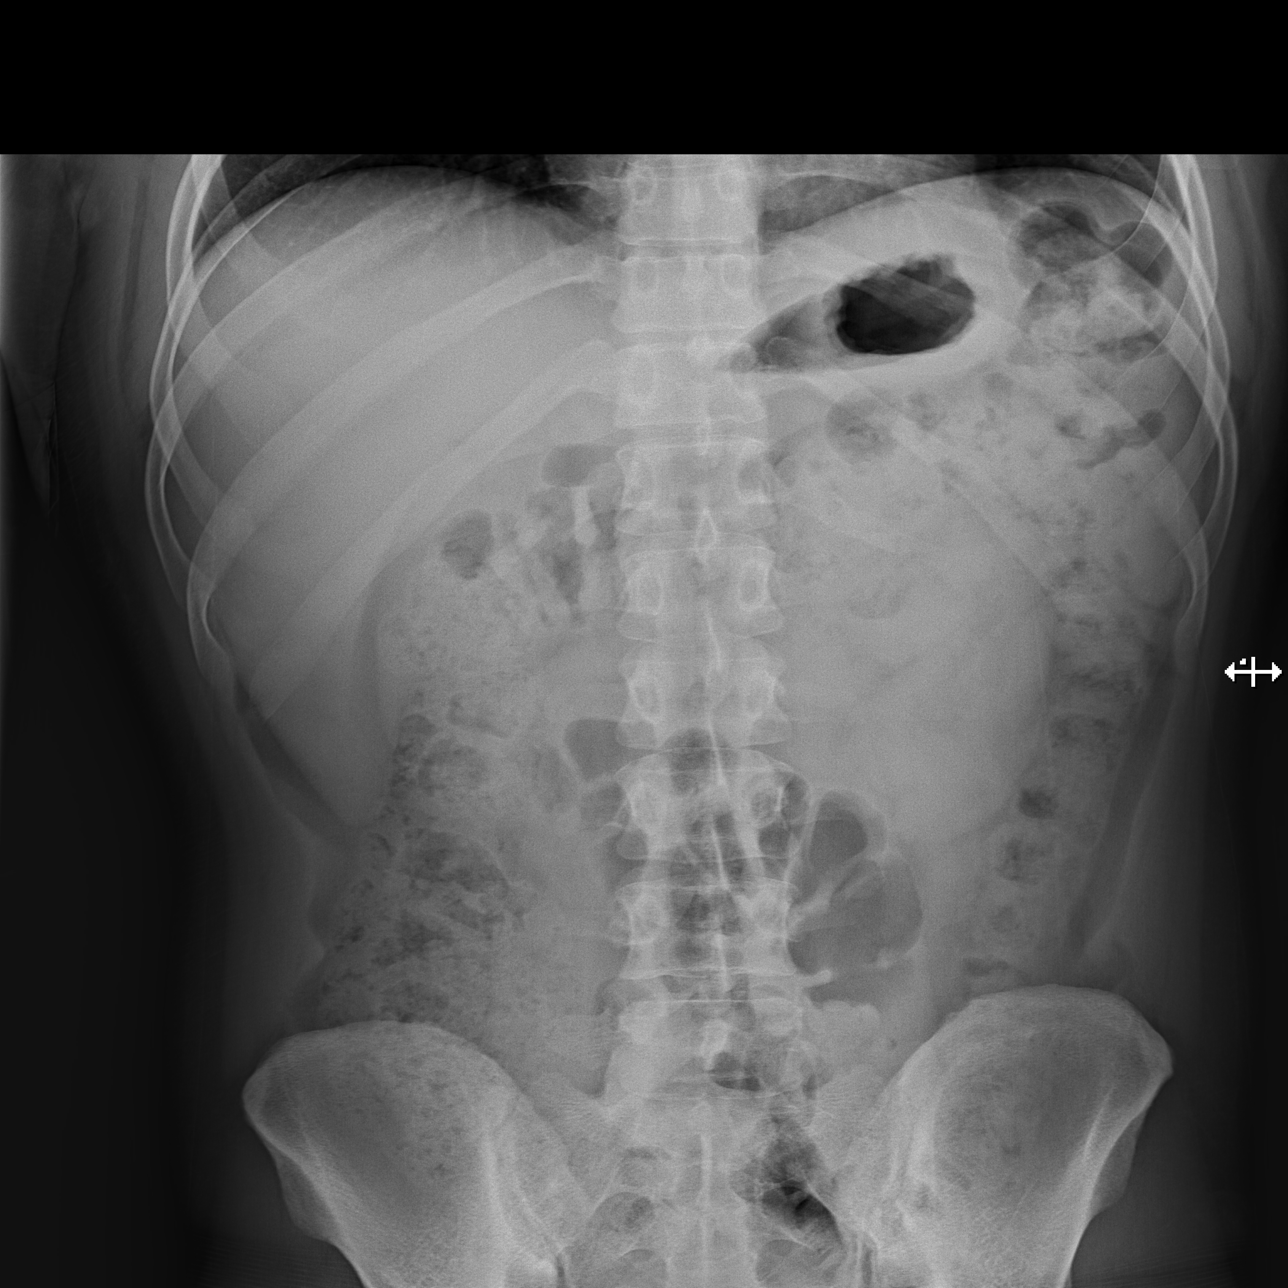

[t abdomen supine]
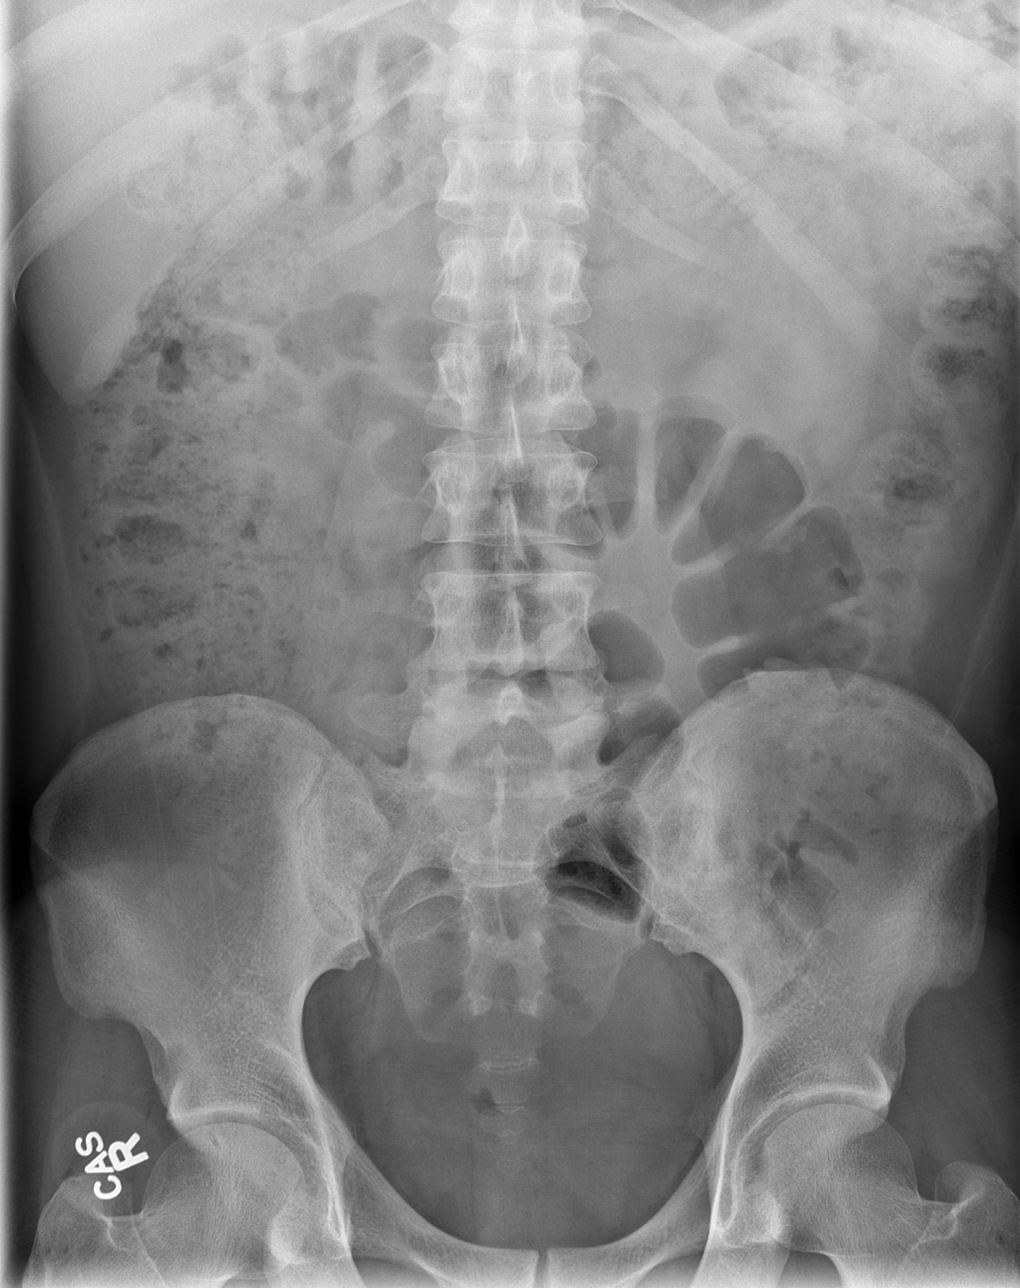

[3 of 3 positions shown; findings below may reference images not displayed]

FINDINGS: The lungs appear clear.  Cardiac and mediastinal contours normal.

No pleural effusion identified.

No free intraperitoneal gas beneath the hemidiaphragms.

Prominent stool throughout the colon favors constipation. No dilated
small bowel loops or abnormal air-fluid levels.
IMPRESSION: 1.  Prominent stool throughout the colon favors constipation.

## 2020-10-23 ENCOUNTER — Ambulatory Visit: Payer: BC Managed Care – PPO | Admitting: Endocrinology

## 2020-10-23 ENCOUNTER — Other Ambulatory Visit: Payer: Self-pay

## 2020-10-23 VITALS — BP 110/70 | HR 76 | Ht 67.0 in | Wt 207.8 lb

## 2020-10-23 DIAGNOSIS — Z794 Long term (current) use of insulin: Secondary | ICD-10-CM

## 2020-10-23 DIAGNOSIS — E119 Type 2 diabetes mellitus without complications: Secondary | ICD-10-CM

## 2020-10-23 LAB — POCT GLYCOSYLATED HEMOGLOBIN (HGB A1C): Hemoglobin A1C: 10.4 % — AB (ref 4.0–5.6)

## 2020-10-23 MED ORDER — TRULICITY 0.75 MG/0.5ML ~~LOC~~ SOAJ: 0.7500 mg | SUBCUTANEOUS | 3 refills | Status: AC

## 2020-10-23 MED ORDER — EMPAGLIFLOZIN 25 MG PO TABS: 25.0000 mg | ORAL_TABLET | Freq: Every day | ORAL | 3 refills | Status: AC

## 2020-10-23 MED ORDER — TRESIBA FLEXTOUCH 100 UNIT/ML ~~LOC~~ SOPN: 55.0000 [IU] | PEN_INJECTOR | Freq: Every day | SUBCUTANEOUS | 3 refills | Status: AC

## 2020-10-23 NOTE — Progress Notes (Signed)
Subjective:    Patient ID: Mark Tyler, male    DOB: 14-Apr-1980, 40 y.o.   MRN: [id number]  HPI pt is referred by Mark Dawley, PA, for diabetes.  Pt states DM was dx'ed in 2009; he is unaware of any chronic complications; he has been on insulin since 2015; pt says his diet and exercise are improved recently; he has never had pancreatitis, pancreatic surgery, severe hypoglycemia or DKA.  He takes Mark Tyler 66 units qd, Novolin 22 units 3 times a day (just before each meal).  He also takes 2 oral meds.  He says cbg varies from 74-250.  It is in general higher as the day goes on.   Past Medical History:  Diagnosis Date   Cataract    Diabetes mellitus without complication (Mark Tyler)     No past surgical history on file.  Social History   Socioeconomic History   Marital status: Single    Spouse name: Not on file   Number of children: Not on file   Years of education: Not on file   Highest education level: Not on file  Mark History   Not on file  Tobacco Use   Smoking status: Every Day    Packs/day: 0.50    Years: 12.00    Pack years: 6.00    Types: Cigarettes   Smokeless tobacco: Not on file  Substance and Sexual Activity   Alcohol use: No    Alcohol/week: 0.0 standard drinks   Drug use: No   Sexual activity: Not on file  Other Topics Concern   Not on file  Social History Narrative   Not on file   Social Determinants of Health   Financial Resource Strain: Not on file  Food Insecurity: Not on file  Transportation Needs: Not on file  Physical Activity: Not on file  Stress: Not on file  Social Connections: Not on file  Intimate Partner Violence: Not on file    Current Outpatient Medications on File Prior to Visit  Medication Sig Dispense Refill   fenofibrate 160 MG tablet fenofibrate 160 mg tablet  TAKE 1 TABLET BY MOUTH EVERY DAY     glimepiride (Mark Tyler) 4 MG tablet Take 4 mg by mouth daily with breakfast.     lisinopril (Mark Tyler) 2.5 MG tablet lisinopril  2.5 mg tablet     metFORMIN (Mark Tyler) 850 MG tablet Take 850 mg by mouth daily.     magnesium citrate SOLN Take 1 Bottle by mouth once. (Patient not taking: Reported on 10/23/2020)     No current facility-administered medications on file prior to visit.    No Known Allergies  Family History  Problem Relation Age of Onset   Diabetes Mother    Diabetes Brother     BP 110/70 (BP Location: Right Arm, Patient Position: Sitting, Cuff Size: Large)   Pulse 76   Ht 5\' 7"  (1.702 m)   Wt 207 lb 12.8 oz (94.3 kg)   SpO2 98%   BMI 32.55 kg/m     Review of Systems denies weight loss, sob, n/v, and depression.      Objective:   Physical Exam Pulses: dorsalis pedis intact bilat.   MSK: no deformity of the feet CV: no leg edema Skin:  no ulcer on the feet.  normal color and temp on the feet. Neuro: sensation is intact to touch on the feet  Lab Results  Component Value Date   HGBA1C 10.4 (A) 10/23/2020   I have reviewed outside records, and summarized:  Pt was noted to have elevated A1c, and referred here.  HTN and dyslipidemia were also addressed     Assessment & Plan:  Insulin-requiring type 2 DM: uncontrolled.  We discussed rx options.    Patient Instructions  good diet and exercise significantly improve the control of your diabetes.  please let me know if you wish to be referred to a dietician.  high blood sugar is very risky to your health.  you should see an eye doctor and dentist every year.  It is very important to get all recommended vaccinations.  Controlling your blood pressure and cholesterol drastically reduces the damage diabetes does to your body.  Those who smoke should quit.  Please discuss these with your doctor.  check your blood sugar twice a day.  vary the time of day when you check, between before the 3 meals, and at bedtime.  also check if you have symptoms of your blood sugar being too high or too low.  please keep a record of the readings and bring it to your  next appointment here (or you can bring the meter itself).  You can write it on any piece of paper.  please call us sooner if your blood sugar goes below 70, or if most of your readings are over 200.   We will need to take this complex situation in stages. For now, please reduce the Mark Tyler to 55 units daily, and: I have sent a prescription to your pharmacy, to add Mark Tyler.  Please continue the same other medications.   Please come back for a follow-up appointment in 2 months.    una buena dieta y ejercicio mejoran significativamente el control de su diabetes. por favor hgamelo saber si desea ser referido a un dietista. el nivel alto de azcar en la sangre es muy peligroso para su salud. usted debe ver a un Mark Tyler y Mark Tyler. Es muy importante obtener todas las vacunas recomendadas. Controlar la presin arterial y el colesterol reduce drsticamente el dao que la diabetes le causa a su cuerpo. Los que fuman deben dejar de Mark Tyler. Hable de esto con su Mark Tyler. controle su nivel de azcar en la Mark Tyler al da. Vare la hora del da en la que realiza la [place], entre antes de las 3 comidas y antes de [place]. tambin verifique si tiene sntomas de que su nivel de azcar en la sangre es demasiado alto o [place]. mantenga un registro de las lecturas y Mark Tyler a su prxima cita aqu (o puede traer Mark Tyler). Puedes escribirlo en cualquier hoja de papel. llmenos antes si su nivel de azcar en la sangre es inferior a 70 o si la mayora de sus lecturas superan los 200. Tendremos que abordar esta compleja situacin por etapas. Por ahora, Mark Tyler a 55 unidades diarias y: He enviado una receta a su farmacia para agregar Mark Tyler. Contine con los mismos otros medicamentos. Vuelva para una cita de seguimiento en 2 meses.

## 2020-10-23 NOTE — Patient Instructions (Addendum)
good diet and exercise significantly improve the control of your diabetes.  please let me know if you wish to be referred to a dietician.  high blood sugar is very risky to your health.  you should see an eye doctor and dentist every year.  It is very important to get all recommended vaccinations.  Controlling your blood pressure and cholesterol drastically reduces the damage diabetes does to your body.  Those who smoke should quit.  Please discuss these with your doctor.  check your blood sugar twice a day.  vary the time of day when you check, between before the 3 meals, and at bedtime.  also check if you have symptoms of your blood sugar being too high or too low.  please keep a record of the readings and bring it to your next appointment here (or you can bring the meter itself).  You can write it on any piece of paper.  please call us sooner if your blood sugar goes below 70, or if most of your readings are over 200.   We will need to take this complex situation in stages. For now, please reduce the [place] to 55 units daily, and: I have sent a prescription to your pharmacy, to add Trulicity.  Please continue the same other medications.   Please come back for a follow-up appointment in 2 months.    una buena dieta y ejercicio mejoran significativamente el control de su diabetes. por favor hgamelo saber si desea ser referido a un dietista. el nivel alto de azcar en la sangre es muy peligroso para su salud. usted debe ver a un Child psychotherapist y Occupational hygienist. Es muy importante obtener todas las vacunas recomendadas. Controlar la presin arterial y el colesterol reduce drsticamente el dao que la diabetes le causa a su cuerpo. Los que fuman deben dejar de Media planner. Hable de esto con su mdico. controle su nivel de azcar en la [employer] al da. Vare la hora del da en la que realiza la [place], entre antes de las 3 comidas y antes de [place]. tambin verifique si tiene sntomas de que su nivel  de azcar en la sangre es demasiado alto o [place]. mantenga un registro de las lecturas y Nurse, adult a su prxima cita aqu (o puede traer Chief Executive Officer). Puedes escribirlo en cualquier hoja de papel. llmenos antes si su nivel de azcar en la sangre es inferior a 70 o si la mayora de sus lecturas superan los 200. Tendremos que abordar esta compleja situacin por etapas. Por ahora, [name] a 55 unidades diarias y: He enviado una receta a su farmacia para agregar Trulicity. Contine con los mismos otros medicamentos. Vuelva para una cita de seguimiento en 2 meses.

## 2020-10-24 DIAGNOSIS — E119 Type 2 diabetes mellitus without complications: Secondary | ICD-10-CM | POA: Insufficient documentation

## 2020-12-25 ENCOUNTER — Ambulatory Visit: Payer: BC Managed Care – PPO | Admitting: Endocrinology
